# Patient Record
Sex: Male | Born: 2000 | Race: Black or African American | Hispanic: No | Marital: Single | Smoking: Never smoker
Health system: Southern US, Community
[De-identification: ages and names within clinical notes are randomized; demographics above are authoritative.]

---

## 2020-09-17 ENCOUNTER — Ambulatory Visit: Payer: Self-pay

## 2020-09-17 ENCOUNTER — Ambulatory Visit: Payer: Self-pay | Attending: Internal Medicine

## 2020-09-17 DIAGNOSIS — Z23 Encounter for immunization: Secondary | ICD-10-CM

## 2020-09-17 NOTE — Progress Notes (Signed)
   Covid-19 Vaccination Clinic  Name:  Troy Sloan    MRN: [id number] DOB: [birth year]  09/17/2020  [name] was observed post Covid-19 immunization for 15 minutes without incident. He was provided with Vaccine Information Sheet and instruction to access the V-Safe system.   [name] was instructed to call 911 with any severe reactions post vaccine: Marland Kitchen Difficulty breathing  . Swelling of face and throat  . A fast heartbeat  . A bad rash all over body  . Dizziness and weakness   Immunizations Administered    Name Date Dose VIS Date Route   JANSSEN COVID-19 VACCINE 09/17/2020  2:39 PM 0.5 mL 02/03/2020 Intramuscular   Manufacturer: [employer]   Lot: [id number]   NDC: 69485-462-70

## 2020-10-10 ENCOUNTER — Emergency Department (HOSPITAL_COMMUNITY)
Admission: EM | Admit: 2020-10-10 | Discharge: 2020-10-10 | Disposition: A | Discharge status: Home / Self Care | Payer: Managed Care, Other (non HMO) | Attending: Emergency Medicine | Admitting: Emergency Medicine

## 2020-10-10 ENCOUNTER — Encounter (HOSPITAL_COMMUNITY): Payer: Self-pay | Admitting: Emergency Medicine

## 2020-10-10 ENCOUNTER — Other Ambulatory Visit: Payer: Self-pay

## 2020-10-10 DIAGNOSIS — B349 Viral infection, unspecified: Secondary | ICD-10-CM | POA: Insufficient documentation

## 2020-10-10 DIAGNOSIS — R509 Fever, unspecified: Secondary | ICD-10-CM | POA: Diagnosis present

## 2020-10-10 DIAGNOSIS — Z20822 Contact with and (suspected) exposure to covid-19: Secondary | ICD-10-CM | POA: Insufficient documentation

## 2020-10-10 LAB — RESPIRATORY PANEL BY RT PCR (FLU A&B, COVID)
Influenza A by PCR: NEGATIVE
Influenza B by PCR: NEGATIVE
SARS Coronavirus 2 by RT PCR: NEGATIVE

## 2020-10-10 LAB — GROUP A STREP BY PCR: Group A Strep by PCR: NOT DETECTED

## 2020-10-10 NOTE — Discharge Instructions (Signed)
Please read and follow all provided instructions.  Your diagnoses today include:  1. Viral infection   2. Febrile illness    Tests performed today include:  Vital signs. See below for your results today.   COVID test - was negative  Strep test - was negative  Medications prescribed:  Please use over-the-counter NSAID medications (ibuprofen, naproxen) as directed on the packaging for pain.   Take any prescribed medications only as directed. Treatment for your infection is aimed at treating the symptoms. There are no medications, such as antibiotics, that will cure your infection.   Home care instructions:  Follow any educational materials contained in this packet.    Your illness is contagious and can be spread to others, especially during the first 3 or 4 days. It cannot be cured by antibiotics or other medicines. Take basic precautions such as washing your hands often, covering your mouth when you cough or sneeze, and avoiding public places where you could spread your illness to others.   Please continue drinking plenty of fluids.  Use over-the-counter medicines as needed as directed on packaging for symptom relief.  You may also use ibuprofen or tylenol as directed on packaging for pain or fever.  Do not take multiple medicines containing Tylenol or acetaminophen to avoid taking too much of this medication.  Follow-up instructions: Please follow-up with your primary care provider in the next 3 days for further evaluation of your symptoms if you are not feeling better.   Return instructions:   Please return to the Emergency Department if you experience worsening symptoms.   RETURN IMMEDIATELY IF you develop shortness of breath, confusion or altered mental status, a new rash, become dizzy, faint, or poorly responsive, or are unable to be cared for at home.  Please return if you have persistent vomiting and cannot keep down fluids or develop a fever that is not controlled by tylenol  or motrin.    Please return if you have any other emergent concerns.  Additional Information:  Your vital signs today were: BP (!) 166/97 (BP Location: Right Arm)    Pulse 90    Temp 100.3 F (37.9 C) (Oral)    Resp 16    Ht 6' (1.829 m)    Wt 108.9 kg    SpO2 97%    BMI 32.55 kg/m  If your blood pressure (BP) was elevated above 135/85 this visit, please have this repeated by your doctor within one month. --------------

## 2020-10-10 NOTE — ED Triage Notes (Addendum)
Arrives via EMS, reports fever, cough and sore throat since yesterday. Fever of 101.6 with EMS, hx of strep throat in the past. Pt reports nausea with one episode of emesis, general malaise.

## 2020-10-10 NOTE — ED Provider Notes (Signed)
[facility]-EMERGENCY DEPT Provider Note   CSN: [id number] Arrival date & time: 10/10/20  1202     History Chief Complaint  Patient presents with  . Sore Throat  . Fever    Troy Sloan is a 19 y.o. male.  Patient presents emergency department today for evaluation of fever, cough, sore throat, malaise and body aches starting yesterday afternoon.  Patient has had one episode of vomiting yesterday.  No significant abdominal pain or diarrhea.  Patient received [employer] Covid vaccine a month ago.  No known sick contacts.  No reported headache, ear pain.  No lower extremity swelling or skin rashes.  No treatments prior to arrival.  Reported temperature with EMS was 101.6 F. The onset of this condition was acute. The course is constant. Aggravating factors: none. Alleviating factors: none.          History reviewed. No pertinent past medical history.  There are no problems to display for this patient.   History reviewed. No pertinent surgical history.     No family history on file.  Social History   Tobacco Use  . Smoking status: Never Smoker  . Smokeless tobacco: Never Used  Vaping Use  . Vaping Use: Some days  Substance Use Topics  . Alcohol use: Yes    Comment: socially  . Drug use: Yes    Frequency: 1.0 times per week    Types: Marijuana    Home Medications Prior to Admission medications   Not on File    Allergies    Patient has no known allergies.  Review of Systems   Review of Systems  Constitutional: Positive for chills, fatigue and fever.  HENT: Positive for sore throat. Negative for congestion, ear pain, rhinorrhea and sinus pressure.   Eyes: Negative for redness.  Respiratory: Positive for cough. Negative for wheezing.   Gastrointestinal: Positive for nausea and vomiting. Negative for abdominal pain and diarrhea.  Genitourinary: Negative for dysuria.  Musculoskeletal: Positive for myalgias. Negative for neck  stiffness.  Skin: Negative for rash.  Neurological: Negative for headaches.  Hematological: Negative for adenopathy.    Physical Exam Updated Vital Signs BP (!) 166/97 (BP Location: Right Arm)   Pulse 90   Temp 100.3 F (37.9 C) (Oral)   Resp 16   Ht 6' (1.829 m)   Wt 108.9 kg   SpO2 97%   BMI 32.55 kg/m   Physical Exam Vitals and nursing note reviewed.  Constitutional:      Appearance: He is well-developed.  HENT:     Head: Normocephalic and atraumatic.     Jaw: No trismus.     Right Ear: Ear canal and external ear normal.     Left Ear: Ear canal and external ear normal.     Ears:     Comments: Cerumen bilaterally obscures TM -- no reported ear pain    Nose: Nose normal. No mucosal edema or rhinorrhea.     Mouth/Throat:     Mouth: Mucous membranes are not dry.     Pharynx: Uvula midline. Posterior oropharyngeal erythema present. No oropharyngeal exudate or uvula swelling.     Tonsils: No tonsillar abscesses.  Eyes:     General:        Right eye: No discharge.        Left eye: No discharge.     Conjunctiva/sclera: Conjunctivae normal.  Cardiovascular:     Rate and Rhythm: Normal rate and regular rhythm.     Heart sounds:  Normal heart sounds.  Pulmonary:     Effort: Pulmonary effort is normal. No respiratory distress.     Breath sounds: Normal breath sounds. No wheezing or rales.  Abdominal:     Palpations: Abdomen is soft.     Tenderness: There is no abdominal tenderness.  Musculoskeletal:     Cervical back: Normal range of motion and neck supple.  Skin:    General: Skin is warm and dry.  Neurological:     Mental Status: He is alert.     ED Results / Procedures / Treatments   Labs (all labs ordered are listed, but only abnormal results are displayed) Labs Reviewed  RESPIRATORY PANEL BY RT PCR (FLU A&B, COVID)  GROUP A STREP BY PCR    EKG None  Radiology No results found.  Procedures Procedures (including critical care time)  Medications  Ordered in ED Medications - No data to display  ED Course  I have reviewed the triage vital signs and the nursing notes.  Pertinent labs & imaging results that were available during my care of the patient were reviewed by me and considered in my medical decision making (see chart for details).  Patient seen and examined. Pt looks well, non-toxic. Will send strep and COVID testing. Awaiting strep results.   Vital signs reviewed and are as follows: BP (!) 166/97 (BP Location: Right Arm)   Pulse 90   Temp 100.3 F (37.9 C) (Oral)   Resp 16   Ht 6' (1.829 m)   Wt 108.9 kg   SpO2 97%   BMI 32.55 kg/m   COVID and strep negative. Patient counseled on supportive care for viral URI and s/s to return including worsening symptoms, persistent fever, persistent vomiting, or if they have any other concerns. Urged to see PCP if symptoms persist for more than 3 days. Patient verbalizes understanding and agrees with plan.      MDM Rules/Calculators/A&P                          Patient with symptoms consistent with a viral syndrome. COVID and strep negative.  Vitals are stable, no fever. No signs of dehydration. Lung exam normal, no signs of pneumonia. Supportive therapy indicated with return if symptoms worsen.    Final Clinical Impression(s) / ED Diagnoses Final diagnoses:  Viral infection  Febrile illness    Rx / DC Orders ED Discharge Orders    None       Renne Crigler, PA-C 10/10/20 1442    Terrilee Files, MD 10/11/20 1031

## 2021-08-24 ENCOUNTER — Emergency Department (HOSPITAL_COMMUNITY)
Admission: EM | Admit: 2021-08-24 | Discharge: 2021-08-24 | Disposition: A | Discharge status: Home / Self Care | Payer: Managed Care, Other (non HMO) | Attending: Emergency Medicine | Admitting: Emergency Medicine

## 2021-08-24 ENCOUNTER — Other Ambulatory Visit: Payer: Self-pay

## 2021-08-24 ENCOUNTER — Emergency Department (HOSPITAL_COMMUNITY): Payer: Managed Care, Other (non HMO)

## 2021-08-24 DIAGNOSIS — Z20822 Contact with and (suspected) exposure to covid-19: Secondary | ICD-10-CM | POA: Diagnosis not present

## 2021-08-24 DIAGNOSIS — J029 Acute pharyngitis, unspecified: Secondary | ICD-10-CM | POA: Insufficient documentation

## 2021-08-24 DIAGNOSIS — R0981 Nasal congestion: Secondary | ICD-10-CM | POA: Diagnosis not present

## 2021-08-24 DIAGNOSIS — R0602 Shortness of breath: Secondary | ICD-10-CM | POA: Insufficient documentation

## 2021-08-24 DIAGNOSIS — R059 Cough, unspecified: Secondary | ICD-10-CM | POA: Insufficient documentation

## 2021-08-24 DIAGNOSIS — R1110 Vomiting, unspecified: Secondary | ICD-10-CM | POA: Diagnosis not present

## 2021-08-24 LAB — RESP PANEL BY RT-PCR (FLU A&B, COVID) ARPGX2
Influenza A by PCR: NEGATIVE
Influenza B by PCR: NEGATIVE
SARS Coronavirus 2 by RT PCR: NEGATIVE

## 2021-08-24 MED ORDER — BENZONATATE 100 MG PO CAPS: 100.0000 mg | ORAL_CAPSULE | Freq: Three times a day (TID) | ORAL | 0 refills | Status: AC | PRN

## 2021-08-24 NOTE — ED Triage Notes (Signed)
Pt c/o cough, congestion, and sob due to the congestion. Reports symptoms started last night.

## 2021-08-24 NOTE — Discharge Instructions (Addendum)
You came to the emergency department today with reports of cough and nasal congestion these symptoms may be due to Covid 19 or another viral illness.  You have a COVID test pending. Please isolate at home while awaiting your results.  You can find results on your [facility] MyChart. > If your test is negative, stay home until your fever has resolved/your symptoms are improving. > If your test is positive, isolate at home for at least 7 days after the day your symptoms initially began, and THEN at least 24 hours after you are fever-free without the help of medications (Tylenol/acetaminophen and Advil/ibuprofen/Motrin) AND your symptoms are improving.  You can alternate Tylenol/acetaminophen and Advil/ibuprofen/Motrin every 4 hours for sore throat, body aches, headache or fever.  Drink plenty of water.  Use saline nasal spray for congestion. You can take Tessalon every 8 hours as needed for cough. Wash your hands frequently. Please rest as needed with frequent repositioning and ambulation as tolerated.    If your symptoms do not improve please follow-up with your primary care provider or urgent care.  Return to the ER for significant shortness of breath, uncontrollable vomiting, severe chest pain, inability to tolerate fluids, changes in mental status such as confusion or other concerning symptoms.

## 2021-08-24 NOTE — ED Provider Notes (Signed)
[facility]-EMERGENCY DEPT Provider Note   CSN: [id number] Arrival date & time: 08/24/21  1040     History Chief Complaint  Patient presents with  . Cough  . Shortness of Breath  . Nasal Congestion    Troy Sloan is a 20 y.o. male with no reported medical history.  Presents to the emergency department with a chief complaint of nasal congestion, cough, and shortness of breath.  Patient reports that his symptoms started yesterday.  Symptoms have been constant since then.  Patient reports cough is productive producing green and yellow mucus.  Nasal congestion without rhinorrhea.  Additionally patient endorses sore throat.  Patient has not tried any modalities to alleviate his symptoms.  Patient reports 1 episode of vomiting 2 days prior.  No hematemesis or coffee-ground emesis.  No known sick contacts.  Patient has been vaccinated for COVID-19.  Has not received COVID-19 booster.  Denies any fever, chills, trouble swallowing, drooling, high potato voice, chest pain, palpitations, leg swelling or tenderness, hemoptysis, abdominal pain, nausea, diarrhea, generalized myalgias, headache, syncope, lightheadedness.   Cough Associated symptoms: shortness of breath and sore throat   Associated symptoms: no chest pain, no chills, no fever, no headaches, no myalgias, no rash and no rhinorrhea   Shortness of Breath Associated symptoms: cough, sore throat and vomiting   Associated symptoms: no abdominal pain, no chest pain, no fever, no headaches, no neck pain and no rash       No past medical history on file.  There are no problems to display for this patient.   No past surgical history on file.     No family history on file.  Social History   Tobacco Use  . Smoking status: Never  . Smokeless tobacco: Never  Vaping Use  . Vaping Use: Some days  Substance Use Topics  . Alcohol use: Yes    Comment: socially  . Drug use: Yes    Frequency: 1.0 times per week     Types: Marijuana    Home Medications Prior to Admission medications   Not on File    Allergies    Patient has no known allergies.  Review of Systems   Review of Systems  Constitutional:  Negative for chills and fever.  HENT:  Positive for congestion and sore throat. Negative for drooling, facial swelling, rhinorrhea, trouble swallowing and voice change.   Eyes:  Negative for visual disturbance.  Respiratory:  Positive for cough and shortness of breath.   Cardiovascular:  Negative for chest pain, palpitations and leg swelling.  Gastrointestinal:  Positive for vomiting. Negative for abdominal distention, abdominal pain, constipation, diarrhea and nausea.  Genitourinary:  Negative for dysuria, frequency and hematuria.  Musculoskeletal:  Negative for back pain, myalgias, neck pain and neck stiffness.  Skin:  Negative for color change and rash.  Neurological:  Negative for dizziness, syncope, light-headedness and headaches.  Psychiatric/Behavioral:  Negative for confusion.    Physical Exam Updated Vital Signs BP (!) 155/83   Pulse 64   Temp 98.2 F (36.8 C)   Resp 19   SpO2 96%   Physical Exam Vitals and nursing note reviewed.  Constitutional:      General: He is not in acute distress.    Appearance: He is not ill-appearing, toxic-appearing or diaphoretic.  HENT:     Head: Normocephalic.     Jaw: There is normal jaw occlusion. No trismus, tenderness, swelling, pain on movement or malocclusion.     Nose:  Right Sinus: No maxillary sinus tenderness or frontal sinus tenderness.     Left Sinus: No maxillary sinus tenderness or frontal sinus tenderness.     Mouth/Throat:     Lips: Pink. No lesions.     Mouth: Mucous membranes are moist. No injury, lacerations, oral lesions or angioedema.     Dentition: No dental abscesses.     Tongue: No lesions. Tongue does not deviate from midline.     Palate: No mass and lesions.     Pharynx: Oropharynx is clear. Uvula midline. No  pharyngeal swelling, oropharyngeal exudate, posterior oropharyngeal erythema or uvula swelling.     Tonsils: No tonsillar exudate or tonsillar abscesses. 2+ on the right. 2+ on the left.     Comments: Patient able to handle oral secretions without difficulty. Eyes:     General: No scleral icterus.       Right eye: No discharge.        Left eye: No discharge.  Cardiovascular:     Rate and Rhythm: Normal rate.  Pulmonary:     Effort: Pulmonary effort is normal. No tachypnea, bradypnea or respiratory distress.     Breath sounds: Normal breath sounds.     Comments: Patient speaks in full complete sentences without difficulty. Abdominal:     General: There is no distension. There are no signs of injury.     Palpations: Abdomen is soft. There is no mass.     Tenderness: There is no abdominal tenderness. There is no guarding or rebound.  Musculoskeletal:     Cervical back: Normal range of motion and neck supple.     Right lower leg: No swelling or tenderness. No edema.     Left lower leg: No swelling or tenderness. No edema.  Lymphadenopathy:     Cervical: No cervical adenopathy.  Skin:    General: Skin is warm and dry.  Neurological:     General: No focal deficit present.     Mental Status: He is alert.  Psychiatric:        Behavior: Behavior is cooperative.    ED Results / Procedures / Treatments   Labs (all labs ordered are listed, but only abnormal results are displayed) Labs Reviewed - No data to display  EKG None  Radiology DG Chest Portable 1 View  Result Date: 08/24/2021 CLINICAL DATA:  Cough and congestion. EXAM: PORTABLE CHEST 1 VIEW COMPARISON:  None. FINDINGS: The heart size and mediastinal contours are within normal limits. Both lungs are clear. The visualized skeletal structures are unremarkable. IMPRESSION: No active disease. Electronically Signed   By: Gerome Sam III M.D.   On: 08/24/2021 12:09    Procedures Procedures   Medications Ordered in  ED Medications - No data to display  ED Course  I have reviewed the triage vital signs and the nursing notes.  Pertinent labs & imaging results that were available during my care of the patient were reviewed by me and considered in my medical decision making (see chart for details).    MDM Rules/Calculators/A&P                           Alert 20 year old male in no acute distress, nontoxic-appearing.  Presents emerged department with a chief complaint of nasal congestion, productive cough, and shortness of breath.  Symptoms started yesterday.  Lungs clear to auscultation bilaterally.  Patient speaks in full complete sentences without difficulty.  Low suspicion for PE at this time  as patient can be ruled out via [facility] criteria.  Will suspicion for sinusitis as patient has no sinus tenderness.  Will obtain x-ray imaging to evaluate for possible pneumonia.  X-ray imaging obtained shows no active cardiopulmonary disease.  Suspect bronchitis versus viral illness.  Patient swabbed for COVID-19 and influenza.  Swab pending at this time.  Patient advised to follow results on his current health MyChart.  If positive for COVID-19 he will self isolate at home.  Will prescribe patient with Tessalon.  Patient to follow-up with his primary care provider.  Discussed results, findings, treatment and follow up. Patient advised of return precautions. Patient verbalized understanding and agreed with plan.  Troy Sloan was evaluated in Emergency Department on 08/24/2021 for the symptoms described in the history of present illness. He was evaluated in the context of the global COVID-19 pandemic, which necessitated consideration that the patient might be at risk for infection with the SARS-CoV-2 virus that causes COVID-19. Institutional protocols and algorithms that pertain to the evaluation of patients at risk for COVID-19 are in a state of rapid change based on information released by regulatory bodies  including the CDC and federal and state organizations. These policies and algorithms were followed during the patient's care in the ED.   Final Clinical Impression(s) / ED Diagnoses Final diagnoses:  None    Rx / DC Orders ED Discharge Orders     None        Berneice Heinrich 08/24/21 1218    Lorre Nick, MD 09/01/21 [phone]

## 2022-09-10 IMAGING — DX DG CHEST 1V PORT
2 series · 2 of 2 positions shown · non-contrast
Comparison: None.

CLINICAL DATA: Cough and congestion.

EXAM:
PORTABLE CHEST 1 VIEW

[chest ap (1 of 2)]
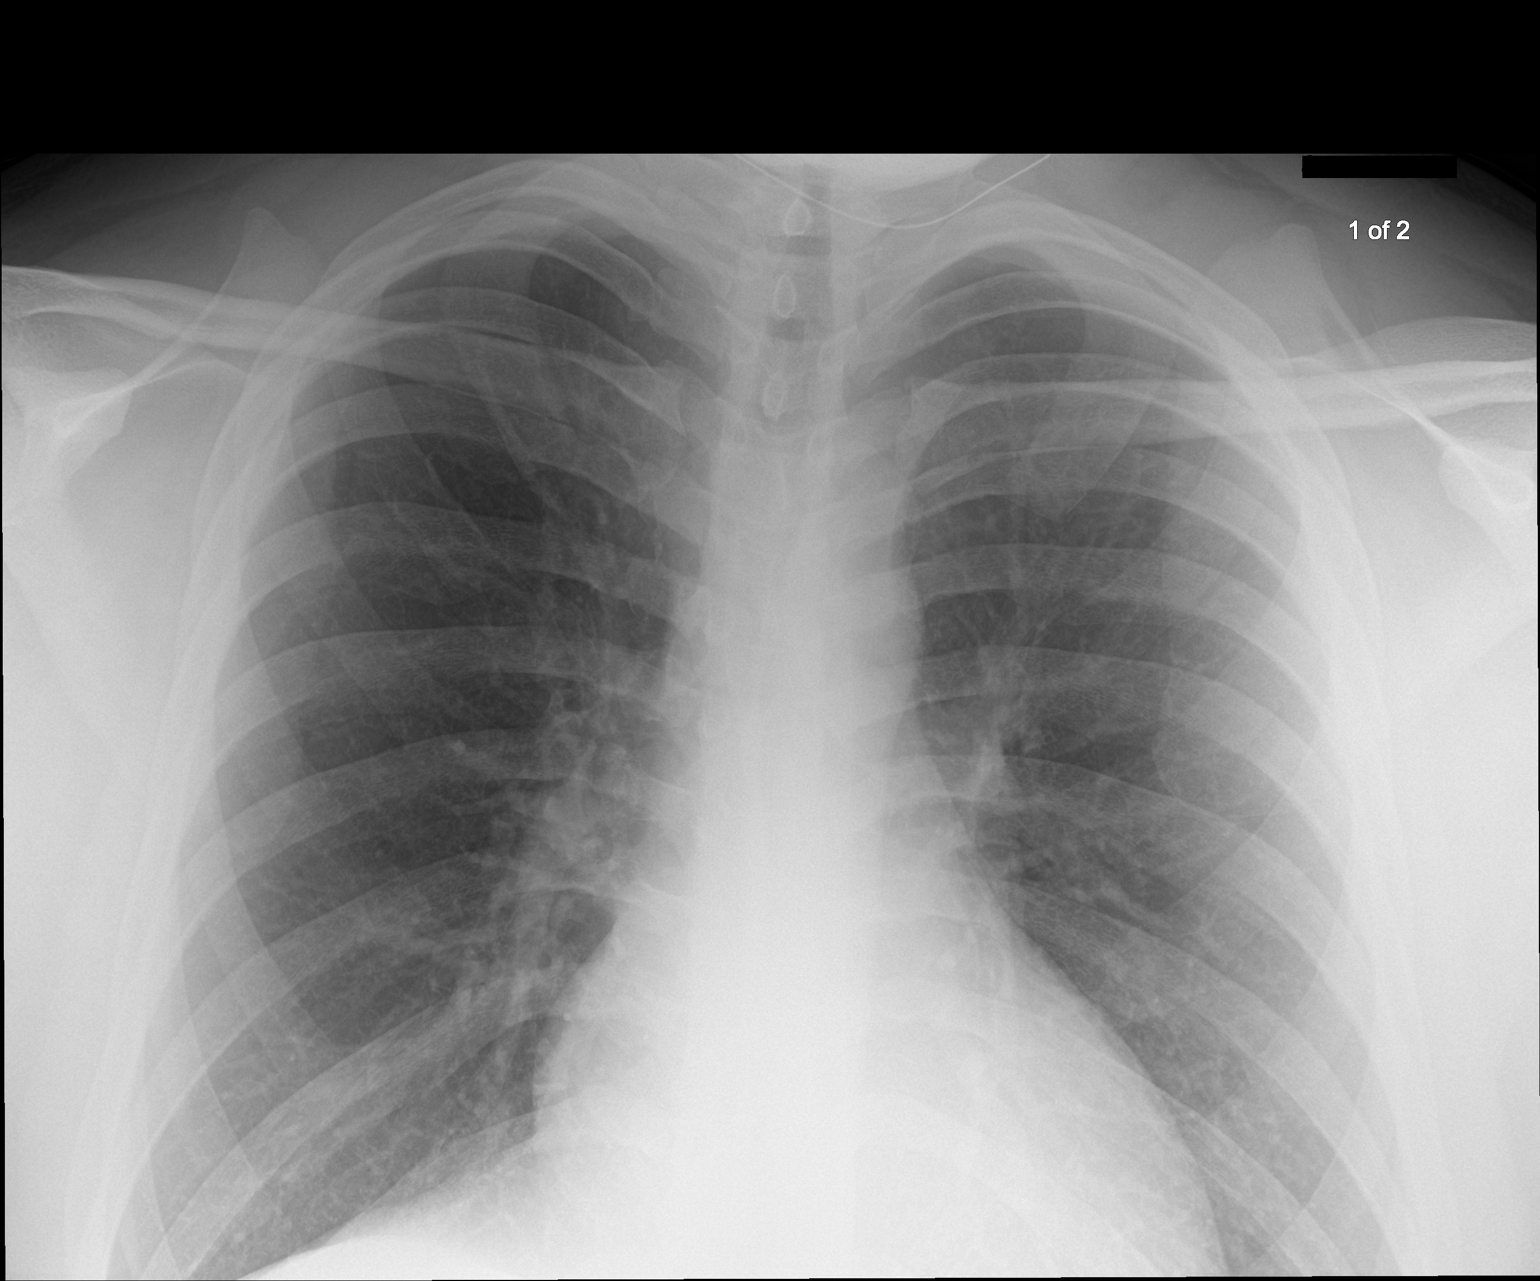

[chest ap (2 of 2)]
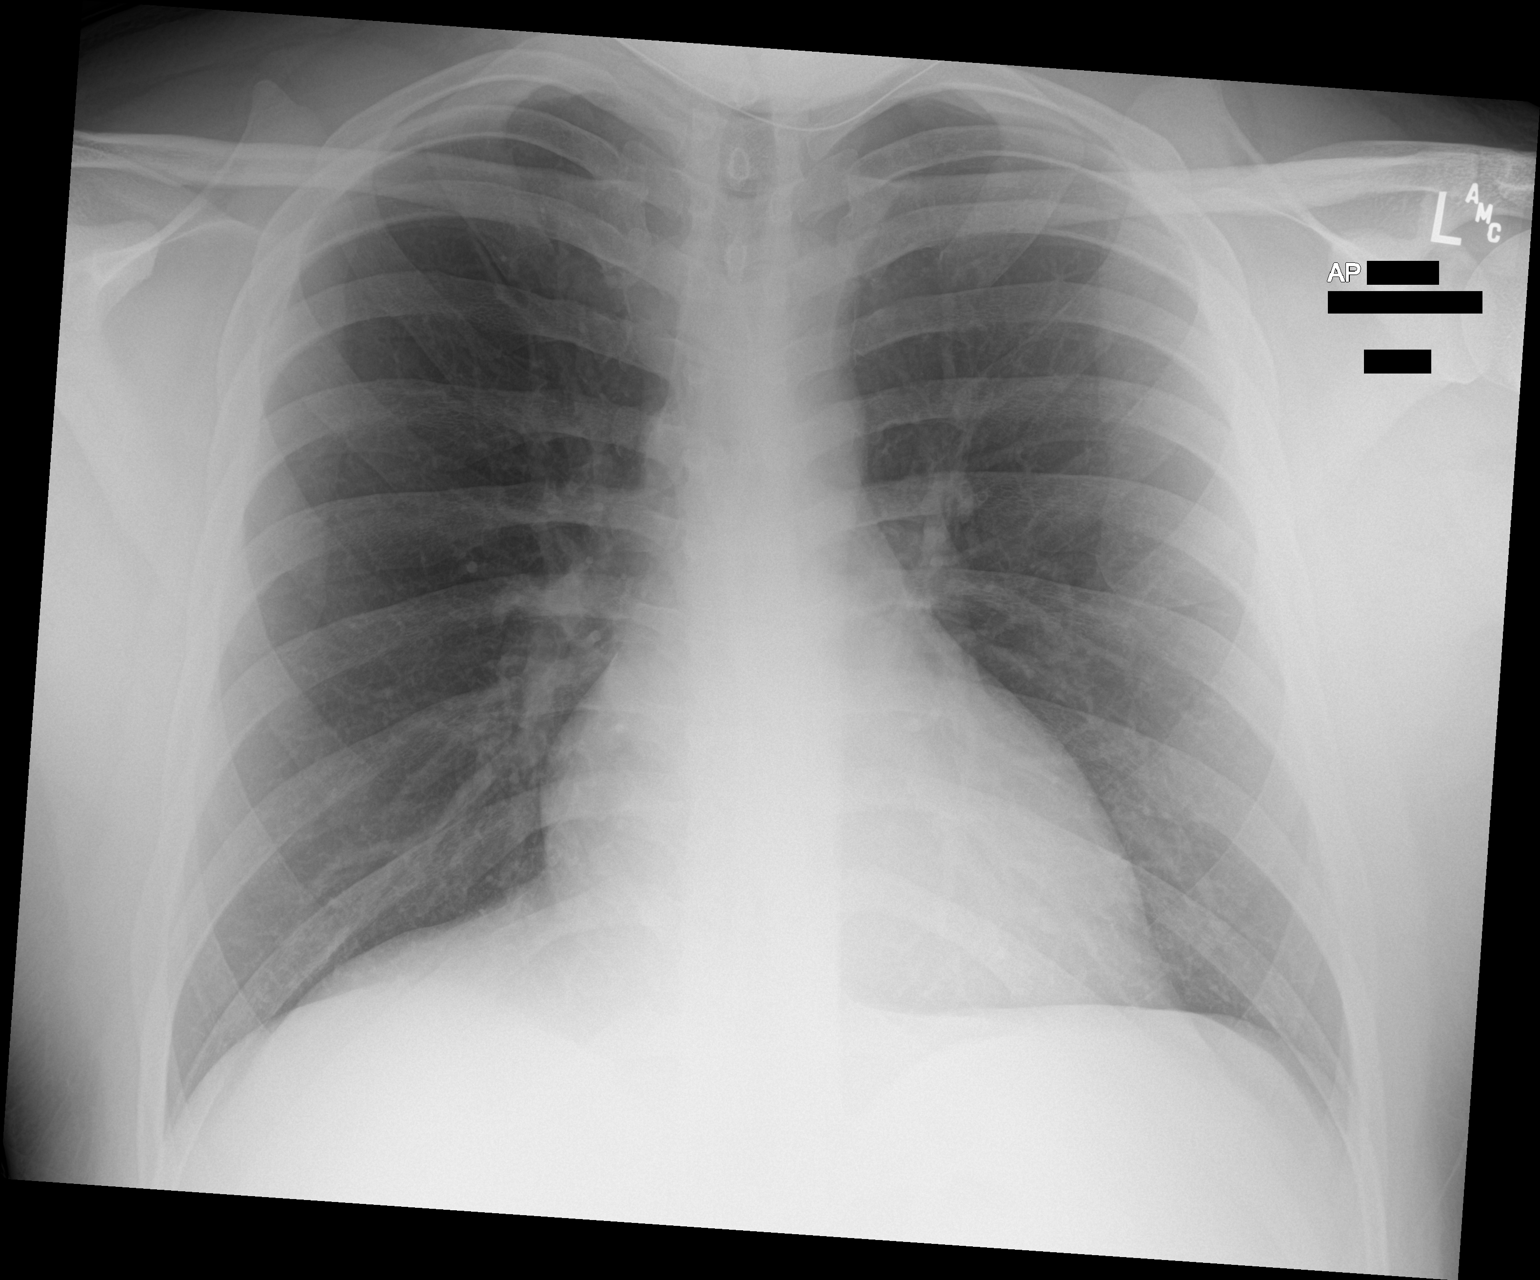

[2 of 2 positions shown; findings below may reference images not displayed]

FINDINGS: The heart size and mediastinal contours are within normal limits.
Both lungs are clear. The visualized skeletal structures are
unremarkable.
IMPRESSION: No active disease.
